# Patient Record
Sex: Male | Born: 1980 | Race: White | Hispanic: No | Marital: Married | Smoking: Former smoker
Health system: Southern US, Community
[De-identification: ages and names within clinical notes are randomized; demographics above are authoritative.]

## PROBLEM LIST (undated history)

## (undated) DIAGNOSIS — K219 Gastro-esophageal reflux disease without esophagitis: Secondary | ICD-10-CM

## (undated) HISTORY — PX: TONSILLECTOMY AND ADENOIDECTOMY: SHX28

## (undated) HISTORY — DX: Gastro-esophageal reflux disease without esophagitis: K21.9

---

## 2015-04-23 ENCOUNTER — Ambulatory Visit: Payer: Self-pay | Admitting: Family Medicine

## 2015-05-22 ENCOUNTER — Encounter: Payer: Self-pay | Admitting: Family Medicine

## 2015-05-22 ENCOUNTER — Ambulatory Visit (INDEPENDENT_AMBULATORY_CARE_PROVIDER_SITE_OTHER): Payer: BLUE CROSS/BLUE SHIELD | Admitting: Family Medicine

## 2015-05-22 VITALS — BP 115/75 | HR 69 | Temp 98.8°F | Resp 16 | Ht 73.0 in | Wt 201.0 lb

## 2015-05-22 DIAGNOSIS — Z7189 Other specified counseling: Secondary | ICD-10-CM

## 2015-05-22 DIAGNOSIS — K219 Gastro-esophageal reflux disease without esophagitis: Secondary | ICD-10-CM | POA: Diagnosis not present

## 2015-05-22 DIAGNOSIS — Z7689 Persons encountering health services in other specified circumstances: Secondary | ICD-10-CM

## 2015-05-22 MED ORDER — PANTOPRAZOLE SODIUM 40 MG PO TBEC: 40.0000 mg | DELAYED_RELEASE_TABLET | Freq: Every day | ORAL | Status: DC

## 2015-05-22 NOTE — Progress Notes (Signed)
Name: Juan Alvarez   MRN: [id number]    DOB: 28-Jun-1980   Date:05/22/2015       Progress Note  Subjective  Chief Complaint  Chief Complaint  Patient presents with  . Establish Care    HPI  Here to establish care.  He has no major medical problems.  C/o heartburn.  Present x 1 year.  Stopped smoking about that time.  Drinks 1-3 beers a week.  Red sauces make heartburn worse.  Greasy foods also makes worse.   Uses TUMS.    No problem-specific assessment & plan notes found for this encounter.   Past Medical History  Diagnosis Date  . GERD (gastroesophageal reflux disease)     Past Surgical History  Procedure Laterality Date  . Tonsillectomy and adenoidectomy N/A 2nd grade    History reviewed. No pertinent family history.  Social History   Social History  . Marital Status: Married    Spouse Name: N/A  . Number of Children: N/A  . Years of Education: N/A   Occupational History  . Not on file.   Social History Main Topics  . Smoking status: Current Every Day Smoker -- 1.00 packs/day for 20 years    Types: Cigarettes  . Smokeless tobacco: Former Neurosurgeon  . Alcohol Use: 1.8 oz/week    3 Cans of beer per week  . Drug Use: Not on file  . Sexual Activity: Not on file   Other Topics Concern  . Not on file   Social History Narrative  . No narrative on file     Current outpatient prescriptions:  .  pantoprazole (PROTONIX) 40 MG tablet, Take 1 tablet (40 mg total) by mouth daily., Disp: 30 tablet, Rfl: 12  Allergies  Allergen Reactions  . Penicillins Rash     Review of Systems  Constitutional: Negative for fever, chills, weight loss and malaise/fatigue.  HENT: Negative for hearing loss.   Eyes: Negative for blurred vision and double vision.  Respiratory: Negative for cough, shortness of breath and wheezing.   Cardiovascular: Negative for chest pain, palpitations and leg swelling.  Gastrointestinal: Positive for heartburn and nausea. Negative for vomiting,  abdominal pain, diarrhea, constipation and blood in stool.  Genitourinary: Negative for dysuria, urgency and frequency.  Musculoskeletal: Negative for myalgias and joint pain.  Skin: Negative for rash.  Neurological: Negative for tremors, weakness and headaches.      Objective  Filed Vitals:   05/22/15 1522  BP: 115/75  Pulse: 69  Temp: 98.8 F (37.1 C)  TempSrc: Oral  Resp: 16  Height:  (1.854 m)  Weight: 201 lb (91.173 kg)  SpO2: 98%    Physical Exam  Constitutional: He is oriented to person, place, and time and well-developed, well-nourished, and in no distress. No distress.  HENT:  Head: Normocephalic and atraumatic.  Eyes: Conjunctivae and EOM are normal. Pupils are equal, round, and reactive to light. No scleral icterus.  Neck: Normal range of motion. Neck supple. Carotid bruit is not present. No thyromegaly present.  Cardiovascular: Normal rate, regular rhythm and normal heart sounds.  Exam reveals no gallop and no friction rub.   No murmur heard. Pulmonary/Chest: Effort normal and breath sounds normal. No respiratory distress. He has no wheezes. He has no rales.  Abdominal: Soft. Bowel sounds are normal. He exhibits no distension and no mass. There is no tenderness.  Musculoskeletal: He exhibits no edema.  Lymphadenopathy:    He has no cervical adenopathy.  Neurological: He is alert and oriented  to person, place, and time.  Vitals reviewed.      No results found for this or any previous visit (from the past 2160 hour(s)).   Assessment & Plan  Problem List Items Addressed This Visit      Digestive   GERD (gastroesophageal reflux disease)   Relevant Medications   pantoprazole (PROTONIX) 40 MG tablet     Other   Encounter to establish care - Primary   Relevant Orders   Comprehensive Metabolic Panel (CMET)   CBC with Differential   Lipid Profile      Meds ordered this encounter  Medications  . pantoprazole (PROTONIX) 40 MG tablet    Sig:  Take 1 tablet (40 mg total) by mouth daily.    Dispense:  30 tablet    Refill:  12   1. Encounter to establish care  - Comprehensive Metabolic Panel (CMET) - CBC with Differential - Lipid Profile  2. Gastroesophageal reflux disease without esophagitis  - pantoprazole (PROTONIX) 40 MG tablet; Take 1 tablet (40 mg total) by mouth daily.  Dispense: 30 tablet; Refill: 12

## 2015-06-09 LAB — COMPREHENSIVE METABOLIC PANEL
A/G RATIO: 1.5 (ref 1.1–2.5)
ALT: 90 IU/L — AB (ref 0–44)
AST: 53 IU/L — AB (ref 0–40)
Albumin: 4.3 g/dL (ref 3.5–5.5)
Alkaline Phosphatase: 75 IU/L (ref 39–117)
BUN/Creatinine Ratio: 11 (ref 8–19)
BUN: 11 mg/dL (ref 6–20)
Bilirubin Total: 0.4 mg/dL (ref 0.0–1.2)
CALCIUM: 9.1 mg/dL (ref 8.7–10.2)
CO2: 24 mmol/L (ref 18–29)
CREATININE: 1.01 mg/dL (ref 0.76–1.27)
Chloride: 97 mmol/L (ref 96–106)
GFR calc Af Amer: 111 mL/min/{1.73_m2} (ref >59)
GFR, EST NON AFRICAN AMERICAN: 96 mL/min/{1.73_m2} (ref >59)
GLUCOSE: 91 mg/dL (ref 65–99)
Globulin, Total: 2.8 g/dL (ref 1.5–4.5)
Potassium: 4.3 mmol/L (ref 3.5–5.2)
Sodium: 139 mmol/L (ref 134–144)
TOTAL PROTEIN: 7.1 g/dL (ref 6.0–8.5)

## 2015-06-09 LAB — CBC WITH DIFFERENTIAL/PLATELET
BASOS: 1 %
Basophils Absolute: 0 10*3/uL (ref 0.0–0.2)
EOS (ABSOLUTE): 0.1 10*3/uL (ref 0.0–0.4)
EOS: 2 %
HEMOGLOBIN: 16.3 g/dL (ref 12.6–17.7)
Hematocrit: 48.1 % (ref 37.5–51.0)
IMMATURE GRANS (ABS): 0 10*3/uL (ref 0.0–0.1)
IMMATURE GRANULOCYTES: 0 %
LYMPHS: 36 %
Lymphocytes Absolute: 2.1 10*3/uL (ref 0.7–3.1)
MCH: 30.8 pg (ref 26.6–33.0)
MCHC: 33.9 g/dL (ref 31.5–35.7)
MCV: 91 fL (ref 79–97)
MONOCYTES: 7 %
Monocytes Absolute: 0.4 10*3/uL (ref 0.1–0.9)
NEUTROS ABS: 3.3 10*3/uL (ref 1.4–7.0)
NEUTROS PCT: 54 %
PLATELETS: 226 10*3/uL (ref 150–379)
RBC: 5.3 x10E6/uL (ref 4.14–5.80)
RDW: 13.3 % (ref 12.3–15.4)
WBC: 6 10*3/uL (ref 3.4–10.8)

## 2015-06-09 LAB — LIPID PANEL
CHOLESTEROL TOTAL: 206 mg/dL — AB (ref 100–199)
Chol/HDL Ratio: 4.9 ratio units (ref 0.0–5.0)
HDL: 42 mg/dL (ref >39)
LDL Calculated: 140 mg/dL — ABNORMAL HIGH (ref 0–99)
Triglycerides: 120 mg/dL (ref 0–149)
VLDL Cholesterol Cal: 24 mg/dL (ref 5–40)

## 2015-06-14 ENCOUNTER — Encounter: Payer: Self-pay | Admitting: *Deleted

## 2015-06-15 LAB — HEPATITIS C ANTIBODY: Hep C Virus Ab: 0.1 s/co ratio (ref 0.0–0.9)

## 2015-06-19 LAB — SPECIMEN STATUS REPORT

## 2015-06-19 LAB — HCV ANTIBODY: Hep C Virus Ab: 0.1 s/co ratio (ref 0.0–0.9)

## 2015-07-03 ENCOUNTER — Ambulatory Visit (INDEPENDENT_AMBULATORY_CARE_PROVIDER_SITE_OTHER): Payer: BLUE CROSS/BLUE SHIELD | Admitting: Family Medicine

## 2015-07-03 ENCOUNTER — Encounter: Payer: Self-pay | Admitting: Family Medicine

## 2015-07-03 VITALS — BP 126/71 | HR 69 | Temp 99.1°F | Resp 16 | Wt 203.4 lb

## 2015-07-03 DIAGNOSIS — K219 Gastro-esophageal reflux disease without esophagitis: Secondary | ICD-10-CM

## 2015-07-03 DIAGNOSIS — R748 Abnormal levels of other serum enzymes: Secondary | ICD-10-CM | POA: Insufficient documentation

## 2015-07-03 NOTE — Progress Notes (Signed)
Name: Juan Alvarez   MRN: [id number]    DOB: [birth year]   Date:07/03/2015       Progress Note  Subjective  Chief Complaint  Chief Complaint  Patient presents with  . Follow-up    Heartburn medication update    HPI Here to f/u gastritis.  HD is much better on PPI.  Still has a brief episode (about 1 min) of epigastric pain about 3 PM each day.  Overall he is doing much better.  Has changed diet.  Much less caffeine.  No tobacco x years.  Rare alcohol.  He has sl. Elevated AST and ALT. His Hep C was neg.    No problem-specific assessment & plan notes found for this encounter.   Past Medical History  Diagnosis Date  . GERD (gastroesophageal reflux disease)     Past Surgical History  Procedure Laterality Date  . Tonsillectomy and adenoidectomy N/A 2nd grade    History reviewed. No pertinent family history.  Social History   Social History  . Marital Status: Married    Spouse Name: N/A  . Number of Children: N/A  . Years of Education: N/A   Occupational History  . Not on file.   Social History Main Topics  . Smoking status: Former Smoker -- 1.00 packs/day for 20 years    Types: Cigarettes  . Smokeless tobacco: Former NeurosurgeonUser  . Alcohol Use: 1.8 oz/week    3 Cans of beer per week  . Drug Use: Not on file  . Sexual Activity: Not on file   Other Topics Concern  . Not on file   Social History Narrative     Current outpatient prescriptions:  .  pantoprazole (PROTONIX) 40 MG tablet, Take 1 tablet (40 mg total) by mouth daily., Disp: 30 tablet, Rfl: 12  Allergies  Allergen Reactions  . Penicillins Rash     Review of Systems  Constitutional: Negative for fever, chills, weight loss and malaise/fatigue.  HENT: Negative for hearing loss.   Eyes: Negative for blurred vision and double vision.  Respiratory: Negative for cough, shortness of breath and wheezing.   Cardiovascular: Negative for chest pain, palpitations and leg swelling.  Gastrointestinal: Positive for  abdominal pain (rare, mild). Negative for heartburn.  Genitourinary: Negative for dysuria, urgency and frequency.  Skin: Negative for rash.  Neurological: Negative for weakness and headaches.      Objective  Filed Vitals:   07/03/15 1621  BP: 126/71  Pulse: 69  Temp: 99.1 F (37.3 C)  TempSrc: Oral  Resp: 16  Weight: 203 lb 6.4 oz (92.262 kg)  SpO2: 96%    Physical Exam  Constitutional: He is well-developed, well-nourished, and in no distress. No distress.  HENT:  Head: Normocephalic and atraumatic.  Neck: Normal range of motion. Neck supple. No thyromegaly present.  Cardiovascular: Normal rate, regular rhythm and normal heart sounds.  Exam reveals no gallop and no friction rub.   No murmur heard. Pulmonary/Chest: Effort normal and breath sounds normal. No respiratory distress. He has no wheezes. He has no rales.  Abdominal: Bowel sounds are normal. He exhibits no distension and no mass. There is no tenderness.  Lymphadenopathy:    He has no cervical adenopathy.  Vitals reviewed.      Recent Results (from the past 2160 hour(s))  Comprehensive Metabolic Panel (CMET)     Status: Abnormal   Collection Time: 06/08/15  8:12 AM  Result Value Ref Range   Glucose 91 65 - 99 mg/dL   BUN 11  6 - 20 mg/dL   Creatinine, Ser 4.09 0.76 - 1.27 mg/dL   GFR calc non Af Amer 96 >59 mL/min/1.73   GFR calc Af Amer 111 >59 mL/min/1.73   BUN/Creatinine Ratio 11 8 - 19   Sodium 139 134 - 144 mmol/L   Potassium 4.3 3.5 - 5.2 mmol/L   Chloride 97 96 - 106 mmol/L   CO2 24 18 - 29 mmol/L   Calcium 9.1 8.7 - 10.2 mg/dL   Total Protein 7.1 6.0 - 8.5 g/dL   Albumin 4.3 3.5 - 5.5 g/dL   Globulin, Total 2.8 1.5 - 4.5 g/dL   Albumin/Globulin Ratio 1.5 1.1 - 2.5    Comment: **Effective June 11, 2015 the reference interval**   for A/G Ratio will be changing to:              Age                Male          Male           0 -  7 days       1.1 - 2.3       1.1 - 2.3           8 - 30 days        1.2 - 2.8       1.2 - 2.8           1 -  6 months     1.3 - 3.6       1.3 - 3.6    7 months -  5 years      1.5 - 2.6       1.5 - 2.6              > 5 years      1.2 - 2.2       1.2 - 2.2    Bilirubin Total 0.4 0.0 - 1.2 mg/dL   Alkaline Phosphatase 75 39 - 117 IU/L   AST 53 (H) 0 - 40 IU/L   ALT 90 (H) 0 - 44 IU/L  CBC with Differential     Status: None   Collection Time: 06/08/15  8:12 AM  Result Value Ref Range   WBC 6.0 3.4 - 10.8 x10E3/uL   RBC 5.30 4.14 - 5.80 x10E6/uL   Hemoglobin 16.3 12.6 - 17.7 g/dL   Hematocrit 81.1 91.4 - 51.0 %   MCV 91 79 - 97 fL   MCH 30.8 26.6 - 33.0 pg   MCHC 33.9 31.5 - 35.7 g/dL   RDW 78.2 95.6 - 21.3 %   Platelets 226 150 - 379 x10E3/uL   Neutrophils 54 %   Lymphs 36 %   Monocytes 7 %   Eos 2 %   Basos 1 %   Neutrophils Absolute 3.3 1.4 - 7.0 x10E3/uL   Lymphocytes Absolute 2.1 0.7 - 3.1 x10E3/uL   Monocytes Absolute 0.4 0.1 - 0.9 x10E3/uL   EOS (ABSOLUTE) 0.1 0.0 - 0.4 x10E3/uL   Basophils Absolute 0.0 0.0 - 0.2 x10E3/uL   Immature Granulocytes 0 %   Immature Grans (Abs) 0.0 0.0 - 0.1 x10E3/uL  Lipid Profile     Status: Abnormal   Collection Time: 06/08/15  8:12 AM  Result Value Ref Range   Cholesterol, Total 206 (H) 100 - 199 mg/dL   Triglycerides 086 0 - 149 mg/dL   HDL 42 >57 mg/dL   VLDL Cholesterol Cal 24  5 - 40 mg/dL   LDL Calculated 161 (H) 0 - 99 mg/dL   Chol/HDL Ratio 4.9 0.0 - 5.0 ratio units    Comment:                                   T. Chol/HDL Ratio                                             Men  Women                               1/2 Avg.Risk  3.4    3.3                                   Avg.Risk  5.0    4.4                                2X Avg.Risk  9.6    7.1                                3X Avg.Risk 23.4   11.0   Hepatitis C antibody     Status: None   Collection Time: 06/08/15  8:12 AM  Result Value Ref Range   Hep C Virus Ab 0.1 0.0 - 0.9 s/co ratio    Comment:                                    Negative:     < 0.8                              Indeterminate: 0.8 - 0.9                                   Positive:     > 0.9  The CDC recommends that a positive HCV antibody result  be followed up with a HCV Nucleic Acid Amplification  test (096045).   Specimen status report     Status: None   Collection Time: 06/08/15  8:12 AM  Result Value Ref Range   specimen status report Comment     Comment: Written Authorization Written Authorization Written Authorization Received. Authorization received from [facility] CMA 06-19-2015 Logged by Harl Bowie   HCV Antibody     Status: None   Collection Time: 06/08/15  8:12 AM  Result Value Ref Range   Hep C Virus Ab 0.1 0.0 - 0.9 s/co ratio    Comment:                                   Negative:     < 0.8  Indeterminate: 0.8 - 0.9                                   Positive:     > 0.9  The CDC recommends that a positive HCV antibody result  be followed up with a HCV Nucleic Acid Amplification  test (161096).      Assessment & Plan  Problem List Items Addressed This Visit      Digestive   GERD (gastroesophageal reflux disease) - Primary     Other   Elevated liver enzymes   Relevant Orders   Hepatic function panel      No orders of the defined types were placed in this encounter.   1. Gastroesophageal reflux disease without esophagitis Cont. Pantoprazole  2. Elevated liver enzymes  - Hepatic function panel-get drawn in about 1 month

## 2015-08-03 DIAGNOSIS — R748 Abnormal levels of other serum enzymes: Secondary | ICD-10-CM | POA: Diagnosis not present

## 2015-08-04 LAB — HEPATIC FUNCTION PANEL
ALT: 40 IU/L (ref 0–44)
AST: 32 IU/L (ref 0–40)
Albumin: 4.4 g/dL (ref 3.5–5.5)
Alkaline Phosphatase: 69 IU/L (ref 39–117)
Bilirubin Total: 0.4 mg/dL (ref 0.0–1.2)
Bilirubin, Direct: 0.1 mg/dL (ref 0.00–0.40)
TOTAL PROTEIN: 6.9 g/dL (ref 6.0–8.5)

## 2016-06-24 DIAGNOSIS — J019 Acute sinusitis, unspecified: Secondary | ICD-10-CM | POA: Diagnosis not present

## 2016-06-24 DIAGNOSIS — R05 Cough: Secondary | ICD-10-CM | POA: Diagnosis not present

## 2017-05-04 ENCOUNTER — Emergency Department: Payer: BLUE CROSS/BLUE SHIELD

## 2017-05-04 ENCOUNTER — Other Ambulatory Visit: Payer: Self-pay

## 2017-05-04 ENCOUNTER — Emergency Department
Admission: EM | Admit: 2017-05-04 | Discharge: 2017-05-04 | Disposition: A | Discharge status: Home / Self Care | Payer: BLUE CROSS/BLUE SHIELD | Attending: Student in an Organized Health Care Education/Training Program | Admitting: Student in an Organized Health Care Education/Training Program

## 2017-05-04 ENCOUNTER — Encounter: Payer: Self-pay | Admitting: Emergency Medicine

## 2017-05-04 DIAGNOSIS — Z135 Encounter for screening for eye and ear disorders: Secondary | ICD-10-CM | POA: Diagnosis not present

## 2017-05-04 DIAGNOSIS — Z79899 Other long term (current) drug therapy: Secondary | ICD-10-CM | POA: Insufficient documentation

## 2017-05-04 DIAGNOSIS — Z87891 Personal history of nicotine dependence: Secondary | ICD-10-CM | POA: Diagnosis not present

## 2017-05-04 DIAGNOSIS — R42 Dizziness and giddiness: Secondary | ICD-10-CM | POA: Insufficient documentation

## 2017-05-04 DIAGNOSIS — T1590XA Foreign body on external eye, part unspecified, unspecified eye, initial encounter: Secondary | ICD-10-CM

## 2017-05-04 DIAGNOSIS — R51 Headache: Secondary | ICD-10-CM | POA: Diagnosis not present

## 2017-05-04 LAB — APTT: aPTT: 28 seconds (ref 24–36)

## 2017-05-04 LAB — CBC
HCT: 48.3 % (ref 40.0–52.0)
Hemoglobin: 16.7 g/dL (ref 13.0–18.0)
MCH: 30.8 pg (ref 26.0–34.0)
MCHC: 34.6 g/dL (ref 32.0–36.0)
MCV: 89.1 fL (ref 80.0–100.0)
Platelets: 201 10*3/uL (ref 150–440)
RBC: 5.43 MIL/uL (ref 4.40–5.90)
RDW: 12.9 % (ref 11.5–14.5)
WBC: 5.6 10*3/uL (ref 3.8–10.6)

## 2017-05-04 LAB — DIFFERENTIAL
Basophils Absolute: 0 10*3/uL (ref 0–0.1)
Basophils Relative: 1 %
EOS ABS: 0.1 10*3/uL (ref 0–0.7)
EOS PCT: 1 %
LYMPHS ABS: 1.6 10*3/uL (ref 1.0–3.6)
Lymphocytes Relative: 29 %
MONO ABS: 0.4 10*3/uL (ref 0.2–1.0)
Monocytes Relative: 7 %
NEUTROS PCT: 62 %
Neutro Abs: 3.5 10*3/uL (ref 1.4–6.5)

## 2017-05-04 LAB — COMPREHENSIVE METABOLIC PANEL
ALT: 115 U/L — ABNORMAL HIGH (ref 17–63)
AST: 62 U/L — ABNORMAL HIGH (ref 15–41)
Albumin: 4.2 g/dL (ref 3.5–5.0)
Alkaline Phosphatase: 64 U/L (ref 38–126)
Anion gap: 10 (ref 5–15)
BUN: 13 mg/dL (ref 6–20)
CO2: 23 mmol/L (ref 22–32)
Calcium: 9.5 mg/dL (ref 8.9–10.3)
Chloride: 107 mmol/L (ref 101–111)
Creatinine, Ser: 0.99 mg/dL (ref 0.61–1.24)
GFR calc Af Amer: >60 mL/min (ref >60)
GFR calc non Af Amer: >60 mL/min (ref >60)
Glucose, Bld: 135 mg/dL — ABNORMAL HIGH (ref 65–99)
Potassium: 3.8 mmol/L (ref 3.5–5.1)
Sodium: 140 mmol/L (ref 135–145)
Total Bilirubin: 0.6 mg/dL (ref 0.3–1.2)
Total Protein: 7.8 g/dL (ref 6.5–8.1)

## 2017-05-04 LAB — PROTIME-INR
INR: 0.94
PROTHROMBIN TIME: 12.5 s (ref 11.4–15.2)

## 2017-05-04 LAB — TROPONIN I

## 2017-05-04 MED ORDER — SODIUM CHLORIDE 0.9 % IV BOLUS (SEPSIS)
1000.0000 mL | Freq: Once | INTRAVENOUS | Status: AC
  Administered 2017-05-04: 1000 mL via INTRAVENOUS

## 2017-05-04 MED ORDER — MECLIZINE HCL 25 MG PO TABS: 25.0000 mg | ORAL_TABLET | Freq: Three times a day (TID) | ORAL | 0 refills | Status: AC | PRN

## 2017-05-04 MED ORDER — IOPAMIDOL (ISOVUE-370) INJECTION 76%
75.0000 mL | Freq: Once | INTRAVENOUS | Status: AC | PRN
  Administered 2017-05-04: 75 mL via INTRAVENOUS

## 2017-05-04 MED ORDER — MECLIZINE HCL 25 MG PO TABS
25.0000 mg | ORAL_TABLET | Freq: Once | ORAL | Status: AC
  Administered 2017-05-04: 25 mg via ORAL
  Filled 2017-05-04: qty 1

## 2017-05-04 NOTE — ED Notes (Signed)
Patient transported to XR. 

## 2017-05-04 NOTE — ED Provider Notes (Signed)
[facility] Emergency Department Provider Note    First MD Initiated Contact with Patient 05/04/17 0818     (approximate)  I have reviewed the triage vital signs and the nursing notes.   HISTORY  Chief Complaint Dizziness    HPI Juan Alvarez is a 37 y.o. male no significant past medical history presents with sudden onset of dizziness is worsened with movement.  Denies any numbness or tingling.  No recent fevers.  Is never had any symptoms like this before.  No headache.  No trauma.  He had a half a shot of rum last night but does not endorse much drinking.  No diarrhea or vomiting.  No blurry vision.  States that symptoms are worsened with movement.  States that when he is lying in bed he was having more difficulty on the left side when he rolled over but got some relief from laying on the right.  Wife became concerned because when she spoke with her phone she says that he sounded like he was slurring his words.  Patient does not recall this states that he was very dizzy was having difficulty walking.  Remote history of smoking.  No history of dysrhythmia.  Past Medical History:  Diagnosis Date  . GERD (gastroesophageal reflux disease)    No family history on file. Past Surgical History:  Procedure Laterality Date  . TONSILLECTOMY AND ADENOIDECTOMY N/A 2nd grade   Patient Active Problem List   Diagnosis Date Noted  . Elevated liver enzymes 07/03/2015  . GERD (gastroesophageal reflux disease) 05/22/2015  . Encounter to establish care 05/22/2015      Prior to Admission medications   Medication Sig Start Date End Date Taking? Authorizing Provider  calcium carbonate (TUMS - DOSED IN MG ELEMENTAL CALCIUM) 500 MG chewable tablet Chew 1 tablet by mouth 3 (three) times daily as needed for heartburn.    Yes [provider]  meclizine (ANTIVERT) 25 MG tablet Take 1 tablet (25 mg total) by mouth 3 (three) times daily as needed for dizziness. 05/04/17    Willy Eddyobinson, Aseem Sessums, MD  pantoprazole (PROTONIX) 40 MG tablet Take 1 tablet (40 mg total) by mouth daily. Patient not taking: Reported on 05/04/2017 05/22/15   Janeann ForehandHawkins, James H Jr., MD    Allergies Penicillins    Social History Social History   Tobacco Use  . Smoking status: Former Smoker    Packs/day: 1.00    Years: 20.00    Pack years: 20.00    Types: Cigarettes  . Smokeless tobacco: Former Engineer, waterUser  Substance Use Topics  . Alcohol use: Yes    Alcohol/week: 1.8 oz    Types: 3 Cans of beer per week  . Drug use: Not on file    Review of Systems Patient denies headaches, rhinorrhea, blurry vision, numbness, shortness of breath, chest pain, edema, cough, abdominal pain, nausea, vomiting, diarrhea, dysuria, fevers, rashes or hallucinations unless otherwise stated above in HPI. ____________________________________________   PHYSICAL EXAM:  VITAL SIGNS: Vitals:   05/04/17 1417 05/04/17 1419  BP:  (!) 140/93  Pulse: 76   Resp: 18   Temp:    SpO2: 97%     Constitutional: Alert and oriented. Well appearing and in no acute distress. Eyes: Conjunctivae are normal.  Head: Atraumatic. Nose: No congestion/rhinnorhea. Mouth/Throat: Mucous membranes are moist.   Neck: No stridor. Painless ROM.  Cardiovascular: Normal rate, regular rhythm. Grossly normal heart sounds.  Good peripheral circulation. Respiratory: Normal respiratory effort.  No retractions. Lungs CTAB. Gastrointestinal:  Soft and nontender. No distention. No abdominal bruits. No CVA tenderness. Genitourinary: Musculoskeletal: No lower extremity tenderness nor edema.  No joint effusions. Neurologic:  CN- intact.  Right inducible nystagmus, - Skew, - impulse No facial droop, Normal FNF.  Normal heel to shin.  Sensation intact bilaterally. Normal speech and language. No gross focal neurologic deficits are appreciated. No gait instability. Skin:  Skin is warm, dry and intact. No rash noted. Psychiatric: Mood and affect are  normal. Speech and behavior are normal.  ____________________________________________   LABS (all labs ordered are listed, but only abnormal results are displayed)  Results for orders placed or performed during the hospital encounter of 05/04/17 (from the past 24 hour(s))  Protime-INR     Status: None   Collection Time: 05/04/17  7:50 AM  Result Value Ref Range   Prothrombin Time 12.5 11.4 - 15.2 seconds   INR 0.94   APTT     Status: None   Collection Time: 05/04/17  7:50 AM  Result Value Ref Range   aPTT 28 24 - 36 seconds  CBC     Status: None   Collection Time: 05/04/17  7:50 AM  Result Value Ref Range   WBC 5.6 3.8 - 10.6 K/uL   RBC 5.43 4.40 - 5.90 MIL/uL   Hemoglobin 16.7 13.0 - 18.0 g/dL   HCT 16.1 09.6 - 04.5 %   MCV 89.1 80.0 - 100.0 fL   MCH 30.8 26.0 - 34.0 pg   MCHC 34.6 32.0 - 36.0 g/dL   RDW 40.9 81.1 - 91.4 %   Platelets 201 150 - 440 K/uL  Differential     Status: None   Collection Time: 05/04/17  7:50 AM  Result Value Ref Range   Neutrophils Relative % 62 %   Neutro Abs 3.5 1.4 - 6.5 K/uL   Lymphocytes Relative 29 %   Lymphs Abs 1.6 1.0 - 3.6 K/uL   Monocytes Relative 7 %   Monocytes Absolute 0.4 0.2 - 1.0 K/uL   Eosinophils Relative 1 %   Eosinophils Absolute 0.1 0 - 0.7 K/uL   Basophils Relative 1 %   Basophils Absolute 0.0 0 - 0.1 K/uL  Comprehensive metabolic panel     Status: Abnormal   Collection Time: 05/04/17  7:50 AM  Result Value Ref Range   Sodium 140 135 - 145 mmol/L   Potassium 3.8 3.5 - 5.1 mmol/L   Chloride 107 101 - 111 mmol/L   CO2 23 22 - 32 mmol/L   Glucose, Bld 135 (H) 65 - 99 mg/dL   BUN 13 6 - 20 mg/dL   Creatinine, Ser 7.82 0.61 - 1.24 mg/dL   Calcium 9.5 8.9 - 95.6 mg/dL   Total Protein 7.8 6.5 - 8.1 g/dL   Albumin 4.2 3.5 - 5.0 g/dL   AST 62 (H) 15 - 41 U/L   ALT 115 (H) 17 - 63 U/L   Alkaline Phosphatase 64 38 - 126 U/L   Total Bilirubin 0.6 0.3 - 1.2 mg/dL   GFR calc non Af Amer >60 >60 mL/min   GFR calc Af Amer  >60 >60 mL/min   Anion gap 10 5 - 15  Troponin I     Status: None   Collection Time: 05/04/17  7:50 AM  Result Value Ref Range   Troponin I <0.03 <0.03 ng/mL   ____________________________________________  EKG My review and personal interpretation at Time: 7:45   Indication: dizziness  Rate: 70  Rhythm: sinus Axis: normal  Other: normal intervals, no stemi, ____________________________________________  RADIOLOGY  I personally reviewed all radiographic images ordered to evaluate for the above acute complaints and reviewed radiology reports and findings.  These findings were personally discussed with the patient.  Please see medical record for radiology report.  ____________________________________________   PROCEDURES  Procedure(s) performed:  Procedures    Critical Care performed: no ____________________________________________   INITIAL IMPRESSION / ASSESSMENT AND PLAN / ED COURSE  Pertinent labs & imaging results that were available during my care of the patient were reviewed by me and considered in my medical decision making (see chart for details).  DDX: bppv, menieres, labyrinthitis, cva, dehydration  Juan Alvarez is a 37 y.o. who presents to the ED with symptoms as described above.  Patient nontoxic appearing.  Neuro exam as above.  Patient will be very low risk for CVA.  Most clinically consistent with vertigo given the positional nature and sudden onset.  Clinical Course as of May 04 1499  Mon May 04, 2017  5366 Patient reassessed.  Still dizzy and having difficulty walking.  Given the differential including CVA labyrinthitis neuroma will order MRI to exclude central lesion.  [PR]  1314 Patient unable to tolerate MRI due to claustrophobia.  This point he has no focal neuro deficits.  Symptoms seem to have significantly improved.  Patient is exceedingly low risk for central lesion and he would be stable and appropriate for outpatient workup.  [PR]  1408 Patient  was agreeable to have CT imaging with IV contrast which showed no evidence of aneurysm ischemia or edema.  Patient stable and appropriate for outpatient follow-up.  [PR]    Clinical Course User Index [PR] Willy Eddy, MD     ____________________________________________   FINAL CLINICAL IMPRESSION(S) / ED DIAGNOSES  Final diagnoses:  Dizziness      NEW MEDICATIONS STARTED DURING THIS VISIT:  Discharge Medication List as of 05/04/2017  2:09 PM    START taking these medications   Details  meclizine (ANTIVERT) 25 MG tablet Take 1 tablet (25 mg total) by mouth 3 (three) times daily as needed for dizziness., Starting Mon 05/04/2017, Print         Note:  This document was prepared using Dragon voice recognition software and may include unintentional dictation errors.    Willy Eddy, MD 05/04/17 1501

## 2017-05-04 NOTE — ED Triage Notes (Addendum)
States awoke this am with dizziness. Went to bed at 2230 last night with no symptoms. Continues to have dizziness at this point. No weakness. Wife states he called her at [phone] and he could not get words and speech was slurred. Speech now clear and appropriate.

## 2017-05-04 NOTE — ED Notes (Signed)
Patient transported to MR. 

## 2017-05-04 NOTE — ED Notes (Signed)
Pt states he went to bed last night around 2230 normal. States he woke up around 0530am with c/o dizziness, states called his wife and she states he was not making any since. Pt speech is clear on arrival, pt states he still has dizziness but not as bad as it was earlier.  No noted neuro deficits on arrival.

## 2017-05-04 NOTE — ED Notes (Signed)
Pt ambulatory upon discharge. Verbalized understanding of discharge instructions, follow-up care and prescription. VSS. Skin warm and dry. A&O x4.  

## 2017-05-11 ENCOUNTER — Encounter: Payer: Self-pay | Admitting: Family Medicine

## 2017-05-11 ENCOUNTER — Ambulatory Visit (INDEPENDENT_AMBULATORY_CARE_PROVIDER_SITE_OTHER): Payer: BLUE CROSS/BLUE SHIELD | Admitting: Family Medicine

## 2017-05-11 VITALS — BP 129/78 | HR 72 | Temp 98.8°F | Resp 16 | Ht 72.0 in | Wt 221.0 lb

## 2017-05-11 DIAGNOSIS — H8112 Benign paroxysmal vertigo, left ear: Secondary | ICD-10-CM | POA: Diagnosis not present

## 2017-05-11 DIAGNOSIS — R748 Abnormal levels of other serum enzymes: Secondary | ICD-10-CM | POA: Diagnosis not present

## 2017-05-11 DIAGNOSIS — K219 Gastro-esophageal reflux disease without esophagitis: Secondary | ICD-10-CM

## 2017-05-11 DIAGNOSIS — Z7689 Persons encountering health services in other specified circumstances: Secondary | ICD-10-CM

## 2017-05-11 NOTE — Progress Notes (Signed)
Subjective:    Patient ID: Juan AndrewJoshua Inga, male    DOB: [birth year], 37 y.o.   MRN: [id number]  Juan Alvarez is a 37 y.o. male presenting on 05/11/2017 for Establish Care and Hospitalization Follow-up (dizzyness)  Patient is here to re-establish care with new PCP after previous PCP Dr Juanetta GoslingHawkins ([facility]) has retired. His last visit with Dr Juanetta GoslingHawkins was 06/2015.  He is here also for a Emergency Department follow-up.  HPI   ED FOLLOW-UP VISIT  Hospital/Location: [facility] Date of ED Visit: 05/04/17  Reason for Presenting to ED: Dizziness  FOLLOW-UP  - ED provider note and record have been reviewed - Patient presents today about 7 days after recent ED visit. Brief summary of recent course, patient had symptoms of acute onset vertigo vs dizziness with "room spinning" and "felt like falling" he stayed in bed for longer in that morning and did not improve, worse if laying on LEFT side of body and turn to Left, he ate half cliff bar and half coke reduced appetite, he got ready for work and then he said his vision got blurry and he was driving to work, he called boss and pulled over in parking lot and then he states he "lost time" and does not recall getting from one parking lot to another, and then friend called 911 / EMS, he was told that in hospital ED he was "confused and slurring words", he states that they checked labs and sugar was fine, and blood pressure was elevated, and he felt "disoriented", he had lab results that showed normal Hgb, elevated non fasting glucose 135, and ALT/ALT liver enzymes were elevated similar but higher result compared to 05/2015, Troponin negative, EKG done unremarkable, Head CT w/o contrast unremarkable, then did x-ray head/eyes to check for metal fragments, 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 Overall symptoms gradually improved, but then had another elevated BP not as high 140/90, similar dizziness, and ultimately he was discharged to outpatient follow-up.  - Today reports overall has still had frequent episodes since discharged with similar less severe dizziness episodes multiple times daily, without worsening or improvement, did not take Meclizine much for this. He tried to rest and seems episodes lasted 10-15 min usually, several per day, still occurring. Currently now not dizzy.  - Admits some Left sided "head not feeling normal" feels like a "pressure deeper in ear", that seems like a Left sided headache episodic few times daily usually lasting 30min to 1 hour, not taking any OTC analgesia for this.  - Also admits associated L sided hand seemed weaker with grip having some tingling  - New medications on discharge: Meclizine, not taking - Changes to current meds on discharge: None  GERD Chronic problem Cannot swallow pills. Can try OTC chewable Zantac  No significant family history of neurological problem or aneurysm.  Health Maintenance: Due for Flu Shot, declines today despite counseling on benefits  Due HIV screen will check old records  [facility] 05/11/2017 05/22/2015  Decreased Interest 0 0  Down, Depressed, Hopeless 0 0  PHQ - 2 Score 0 0    Past Medical History:  Diagnosis Date  . GERD (gastroesophageal reflux disease)    Past Surgical History:  Procedure Laterality Date  .  TONSILLECTOMY AND ADENOIDECTOMY N/A 2nd grade   Social History   Socioeconomic History  . Marital status: Married    Spouse name: Not on file  . Number of children: Not on file  . Years of education: Not on file  . Highest education level: Not on file  Social Needs  . Financial resource strain: Not on file  . Food insecurity - worry: Not on file  . Food insecurity - inability: Not on file  . Transportation needs -  medical: Not on file  . Transportation needs - non-medical: Not on file  Occupational History  . Occupation: Sheet Metal  Tobacco Use  . Smoking status: Former Smoker    Packs/day: 1.00    Years: 20.00    Pack years: 20.00    Types: Cigarettes    Last attempt to quit: 05/19/2014    Years since quitting: 2.9  . Smokeless tobacco: Former Engineer, water and Sexual Activity  . Alcohol use: Yes    Alcohol/week: 1.8 oz    Types: 3 Cans of beer per week  . Drug use: No  . Sexual activity: Not on file  Other Topics Concern  . Not on file  Social History Narrative  . Not on file   History reviewed. No pertinent family history. Current Outpatient Medications on File Prior to Visit  Medication Sig  . calcium carbonate (TUMS - DOSED IN MG ELEMENTAL CALCIUM) 500 MG chewable tablet Chew 1 tablet by mouth 3 (three) times daily as needed for heartburn.   . meclizine (ANTIVERT) 25 MG tablet Take 1 tablet (25 mg total) by mouth 3 (three) times daily as needed for dizziness. (Patient not taking: Reported on 05/11/2017)   No current facility-administered medications on file prior to visit.     Review of Systems  Constitutional: Negative for activity change, appetite change, chills, diaphoresis, fatigue and fever.  HENT: Negative for congestion and hearing loss.   Eyes: Negative for visual disturbance.  Respiratory: Negative for apnea, cough, choking, chest tightness, shortness of breath and wheezing.   Cardiovascular: Negative for chest pain, palpitations and leg swelling.  Gastrointestinal: Negative for abdominal pain, constipation, diarrhea, nausea and vomiting.  Endocrine: Negative for cold intolerance.  Genitourinary: Negative for decreased urine volume, dysuria, frequency, hematuria and urgency.  Musculoskeletal: Negative for arthralgias and neck pain.  Skin: Negative for rash.  Allergic/Immunologic: Negative for environmental allergies.  Neurological: Positive for dizziness, weakness  and headaches. Negative for light-headedness and numbness.  Hematological: Negative for adenopathy.  Psychiatric/Behavioral: Negative for behavioral problems, dysphoric mood and sleep disturbance. The patient is not nervous/anxious.    Per HPI unless specifically indicated above     Objective:    BP 129/78   Pulse 72   Temp 98.8 F (37.1 C) (Oral)   Resp 16   Ht 6' (1.829 m)   Wt 221 lb (100.2 kg)   BMI 29.97 kg/m   Wt Readings from Last 3 Encounters:  05/11/17 221 lb (100.2 kg)  05/04/17 208 lb (94.3 kg)  07/03/15 203 lb 6.4 oz (92.3 kg)    Physical Exam  Constitutional: He is oriented to person, place, and time. He appears well-developed and well-nourished. No distress.  Well-appearing, comfortable, cooperative  HENT:  Head: Normocephalic and atraumatic.  Mouth/Throat: Oropharynx is clear and moist.  Frontal / maxillary sinuses non-tender. Nares patent without purulence or edema. Bilateral TMs clear without erythema, effusion or bulging. Oropharynx clear without erythema, exudates, edema or asymmetry.  Dix-hallpike maneuver without reproduced dizziness  or nystagmus bilateral  Eyes: Conjunctivae are normal. Right eye exhibits no discharge. Left eye exhibits no discharge.  Neck: Normal range of motion. Neck supple. No thyromegaly present.  Cardiovascular: Normal rate, regular rhythm, normal heart sounds and intact distal pulses.  No murmur heard. Pulmonary/Chest: Effort normal and breath sounds normal. No respiratory distress. He has no wheezes. He has no rales.  Musculoskeletal: Normal range of motion. He exhibits no edema.  Lymphadenopathy:    He has no cervical adenopathy.  Neurological: He is alert and oriented to person, place, and time. No cranial nerve deficit.  Distal sensation to light touch intact, grip strength intact  Skin: Skin is warm and dry. No rash noted. He is not diaphoretic. No erythema.  Psychiatric: He has a normal mood and affect. His behavior is  normal.  Well groomed, good eye contact, normal speech and thoughts  Nursing note and vitals reviewed.  Results for orders placed or performed during the hospital encounter of 05/04/17  Protime-INR  Result Value Ref Range   Prothrombin Time 12.5 11.4 - 15.2 seconds   INR 0.94   APTT  Result Value Ref Range   aPTT 28 24 - 36 seconds  CBC  Result Value Ref Range   WBC 5.6 3.8 - 10.6 K/uL   RBC 5.43 4.40 - 5.90 MIL/uL   Hemoglobin 16.7 13.0 - 18.0 g/dL   HCT 16.1 09.6 - 04.5 %   MCV 89.1 80.0 - 100.0 fL   MCH 30.8 26.0 - 34.0 pg   MCHC 34.6 32.0 - 36.0 g/dL   RDW 40.9 81.1 - 91.4 %   Platelets 201 150 - 440 K/uL  Differential  Result Value Ref Range   Neutrophils Relative % 62 %   Neutro Abs 3.5 1.4 - 6.5 K/uL   Lymphocytes Relative 29 %   Lymphs Abs 1.6 1.0 - 3.6 K/uL   Monocytes Relative 7 %   Monocytes Absolute 0.4 0.2 - 1.0 K/uL   Eosinophils Relative 1 %   Eosinophils Absolute 0.1 0 - 0.7 K/uL   Basophils Relative 1 %   Basophils Absolute 0.0 0 - 0.1 K/uL  Comprehensive metabolic panel  Result Value Ref Range   Sodium 140 135 - 145 mmol/L   Potassium 3.8 3.5 - 5.1 mmol/L   Chloride 107 101 - 111 mmol/L   CO2 23 22 - 32 mmol/L   Glucose, Bld 135 (H) 65 - 99 mg/dL   BUN 13 6 - 20 mg/dL   Creatinine, Ser 7.82 0.61 - 1.24 mg/dL   Calcium 9.5 8.9 - 95.6 mg/dL   Total Protein 7.8 6.5 - 8.1 g/dL   Albumin 4.2 3.5 - 5.0 g/dL   AST 62 (H) 15 - 41 U/L   ALT 115 (H) 17 - 63 U/L   Alkaline Phosphatase 64 38 - 126 U/L   Total Bilirubin 0.6 0.3 - 1.2 mg/dL   GFR calc non Af Amer >60 >60 mL/min   GFR calc Af Amer >60 >60 mL/min   Anion gap 10 5 - 15  Troponin I  Result Value Ref Range   Troponin I <0.03 <0.03 ng/mL      Assessment & Plan:   Problem List Items Addressed This Visit    Elevated liver enzymes Uncertain etiology, possible fatty liver, will need lipid panel    GERD (gastroesophageal reflux disease)    Chronic problem Not focus of visit Trial OTC  therapy H2 blocker first BID 2-4 weeks then PRN  Other Visit Diagnoses    BPPV (benign paroxysmal positional vertigo), left    -  Primary Suspected L-BPPV by history not supported by R/L Dix-Hallpike - Concern with other related associated symptoms with L sided headache vs sinus pressure  Plan: 1. Handout given with Epley maneuver TID for 1-2 weeks until resolved 2. Referral to [facility] ENT for 2nd opinion given strong suspicion of vertigo, prior work-up in ED negative for neurological cause, may consider future MRI if required vs neuro referral 3. Return criteria, if not improved consider vestibular PT referral    Encounter to establish care with new doctor     Will review prior PCP records       No orders of the defined types were placed in this encounter.  Orders Placed This Encounter  Procedures  . Ambulatory referral to ENT    Referral Priority:   Routine    Referral Type:   Consultation    Referral Reason:   Specialty Services Required    Requested Specialty:   Otolaryngology    Number of Visits Requested:   1      Follow up plan: Return in about 4 weeks (around 06/08/2017) for Annual Physical.  Saralyn Pilar, DO [facility] 05/12/2017, 1:37 AM

## 2017-05-11 NOTE — Patient Instructions (Addendum)
Thank you for coming to the office today.  1. You have symptoms of Vertigo (Benign Paroxysmal Positional Vertigo) - This is commonly caused by inner ear fluid imbalance, sometimes can be worsened by allergies and sinus symptoms, otherwise it can occur randomly sometimes and we may never discover the exact cause. - To treat this, try the Epley Manuever (see diagrams/instructions below) at home up to 3 times a day for 1-2 weeks or until symptoms resolve - You may take Meclizine as needed up to 3 times a day for dizziness, this will not cure symptoms but may help. Caution may make you drowsy.  For GERD acid reflux - try Chewable zantac usually recommend TWICE daily before meals every day for 2 weeks or more  - Avoid spicy, greasy, fried foods, also things like caffeine, dark chocolate, peppermint can worsen - Avoid large meals and late night snacks, also do not go more than 4-5 hours without a snack or meal (not eating will worsen reflux symptoms due to stomach acid)  If the problem improves but keeps coming back, we can discuss higher dose or longer course at next visit.  If symptoms are worsening, persistent symptoms despite treatment or develop esophageal or abdominal pain, unable to swallow solids or liquids, nausea, vomiting, fever/chills, or unintentional weight loss / no appetite, please follow-up sooner or seek more immediate medical attention.   Referral sent to ENT - stay tuned for appointment - should call within 1-2 weeks, if you don't hear back then call them first by end of the week  [facility] [address]  [place], [place] [address] Ph: [phone]  [facility] [address]  [place], [place] [address] Ph: [phone]   If you develop significant worsening episode with vertigo that does not improve and you get severe headache, loss of vision, arm or leg weakness, slurred speech, or other concerning symptoms please seek  immediate medical attention at Emergency Department.  Please schedule a follow-up appointment with Dr Althea CharonKaramalegos within 2-4 weeks if Vertigo not improving  See the next page for images describing the Epley Manuever.     ----------------------------------------------------------------------------------------------------------------------      DUE for FASTING BLOOD WORK (no food or drink after midnight before the lab appointment, only water or coffee without cream/sugar on the morning of)  SCHEDULE "Lab Only" visit in the morning at the clinic for lab draw in 4 weeks  - Make sure Lab Only appointment is at about 1 week before your next appointment, so that results will be available  For Lab Results, once available within 2-3 days of blood draw, you can can log in to MyChart online to view your results and a brief explanation. Also, we can discuss results at next follow-up visit.   Please schedule a Follow-up Appointment to: Return in about 4 weeks (around 06/08/2017) for Annual Physical.    If you have any other questions or concerns, please feel free to call the office or send a message through MyChart. You may also schedule an earlier appointment if necessary.  Additionally, you may be receiving a survey about your experience at our office within a few days to 1 week by e-mail or mail. We value your feedback.  Saralyn PilarAlexander Karamalegos, DO [facility]

## 2017-05-12 ENCOUNTER — Other Ambulatory Visit: Payer: Self-pay | Admitting: Family Medicine

## 2017-05-12 DIAGNOSIS — R748 Abnormal levels of other serum enzymes: Secondary | ICD-10-CM

## 2017-05-12 DIAGNOSIS — R7309 Other abnormal glucose: Secondary | ICD-10-CM

## 2017-05-12 DIAGNOSIS — Z0000 Encounter for general adult medical examination without abnormal findings: Secondary | ICD-10-CM

## 2017-05-12 DIAGNOSIS — K219 Gastro-esophageal reflux disease without esophagitis: Secondary | ICD-10-CM

## 2017-05-12 DIAGNOSIS — E7800 Pure hypercholesterolemia, unspecified: Secondary | ICD-10-CM | POA: Insufficient documentation

## 2017-05-12 DIAGNOSIS — Z114 Encounter for screening for human immunodeficiency virus [HIV]: Secondary | ICD-10-CM

## 2017-05-12 NOTE — Assessment & Plan Note (Signed)
Chronic problem Not focus of visit Trial OTC therapy H2 blocker first BID 2-4 weeks then PRN

## 2017-06-02 DIAGNOSIS — R42 Dizziness and giddiness: Secondary | ICD-10-CM | POA: Diagnosis not present

## 2017-06-05 ENCOUNTER — Telehealth: Payer: Self-pay | Admitting: Family Medicine

## 2017-06-05 NOTE — Telephone Encounter (Signed)
Pt. Called wanting to know if he needed to cancel appt for Monday he have appt with ENT  March, 20 th, he  Have not had lab. Pt  Call back # is [phone]

## 2017-06-05 NOTE — Telephone Encounter (Signed)
Since he does not see ENT until March 20th, then I would recommend that he reschedule the apt for Monday 06/08/17 for me for Annual Physical.  He has blood work that is ordered. He should re-schedule for both fasting blood work and Annual Physical anytime days to week or so after his upcoming ENT.  Please call patient back to notify.  Saralyn PilarAlexander Isidor Bromell, DO [facility] 06/05/2017, 12:50 PM

## 2017-06-08 ENCOUNTER — Encounter: Payer: BLUE CROSS/BLUE SHIELD | Admitting: Family Medicine

## 2017-06-17 DIAGNOSIS — R42 Dizziness and giddiness: Secondary | ICD-10-CM | POA: Diagnosis not present

## 2017-06-24 DIAGNOSIS — R42 Dizziness and giddiness: Secondary | ICD-10-CM | POA: Diagnosis not present

## 2017-07-03 DIAGNOSIS — R42 Dizziness and giddiness: Secondary | ICD-10-CM | POA: Diagnosis not present

## 2018-06-09 DIAGNOSIS — J029 Acute pharyngitis, unspecified: Secondary | ICD-10-CM | POA: Diagnosis not present

## 2018-06-09 DIAGNOSIS — R509 Fever, unspecified: Secondary | ICD-10-CM | POA: Diagnosis not present

## 2018-06-09 DIAGNOSIS — R6889 Other general symptoms and signs: Secondary | ICD-10-CM | POA: Diagnosis not present

## 2018-06-09 DIAGNOSIS — J101 Influenza due to other identified influenza virus with other respiratory manifestations: Secondary | ICD-10-CM | POA: Diagnosis not present

## 2019-03-12 IMAGING — CT CT ANGIO HEAD
3 of 8 series · 16 of 47 positions shown · IV contrast (iopamidol)
Comparison: CT head 05/14/2017

CLINICAL DATA: Severe headache, worst headache of life.  Dizziness

EXAM:
CT ANGIOGRAPHY HEAD
TECHNIQUE: Multidetector CT imaging of the head was performed using the
standard protocol during bolus administration of intravenous
contrast. Multiplanar CT image reconstructions and MIPs were
obtained to evaluate the vascular anatomy.
CONTRAST:  75mL 3FPUFR-BCJ IOPAMIDOL (3FPUFR-BCJ) INJECTION 76%

[Series 7: ax thin · axial · 0.33mm/px · z∈[-149,-18]mm · 10 of 153 slices shown]
[im 11/153  brain]
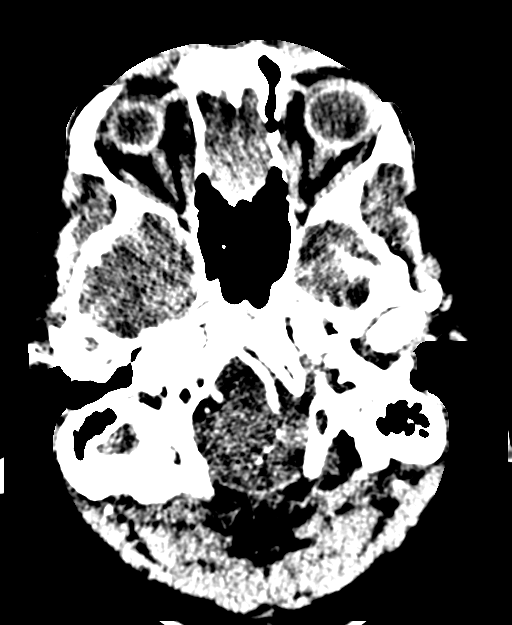
[im 31/153  bone]
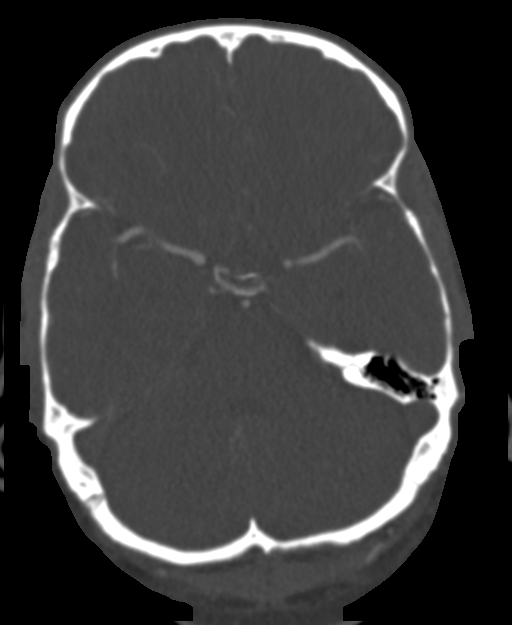
[im 41/153  brain]
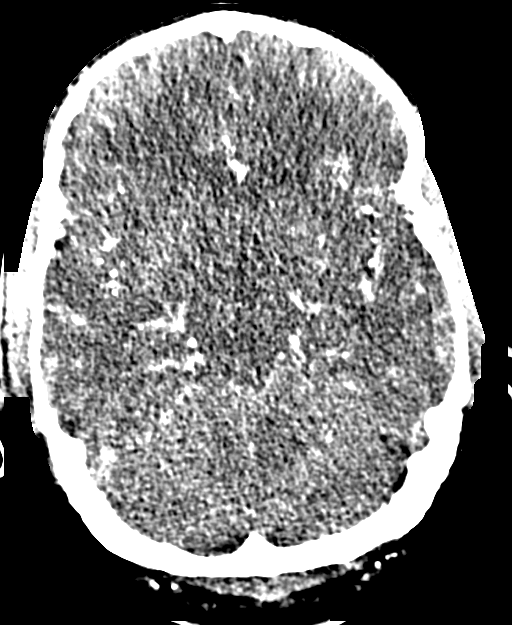
[im 51/153  bone]
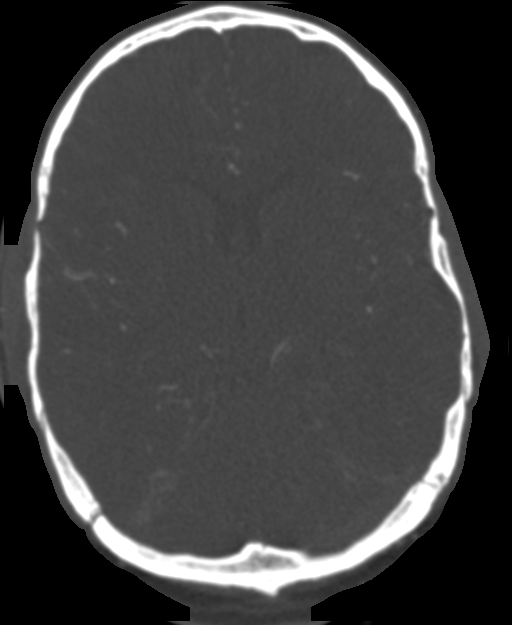
[im 71/153  brain]
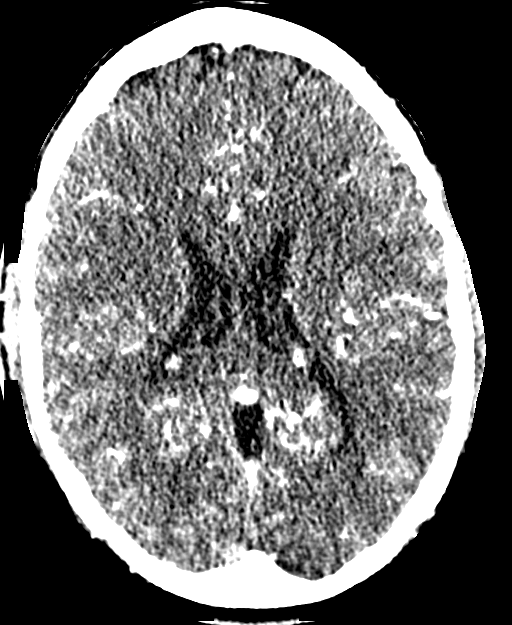
[im 82/153  bone]
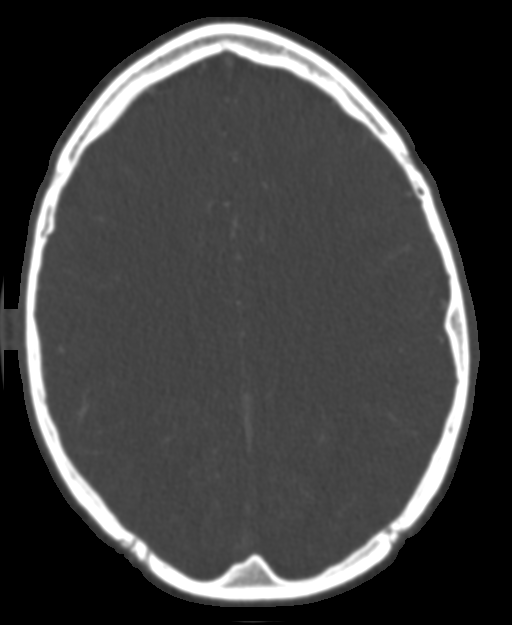
[im 102/153  brain]
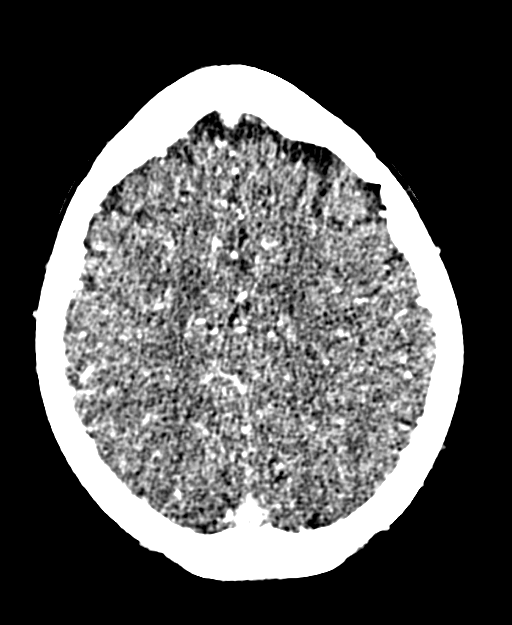
[im 112/153  bone]
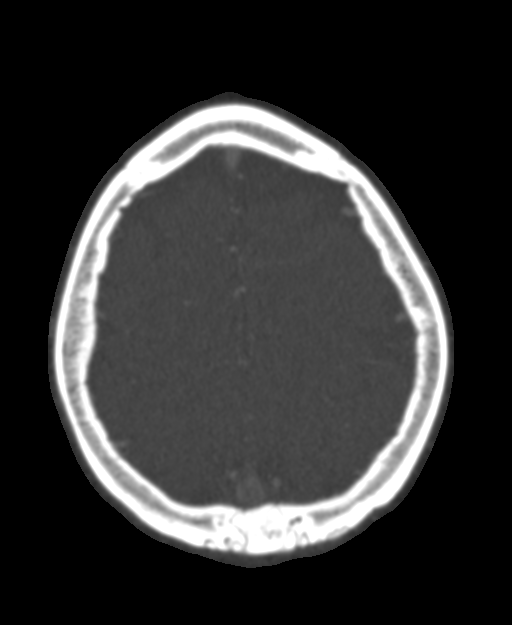
[im 122/153  brain]
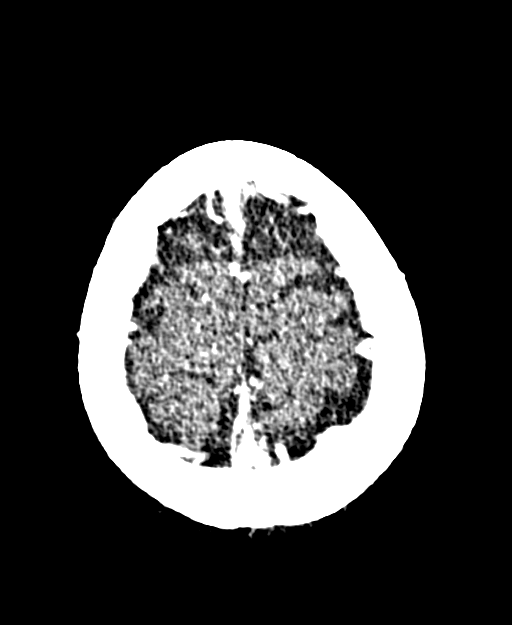
[im 142/153  bone]
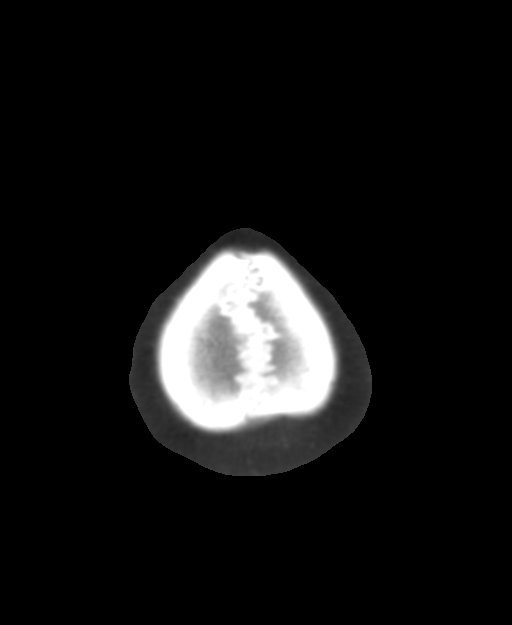

[Series 9: cor thin · coronal · 0.30mm/px · 3 of 208 slices shown]
[im 60/208  brain]
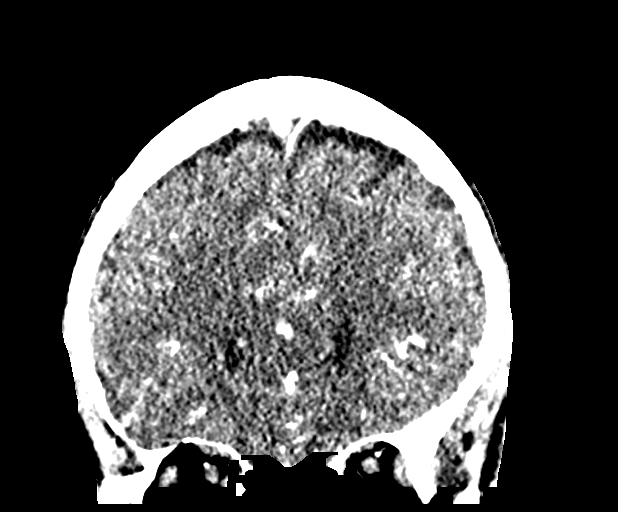
[im 89/208  brain]
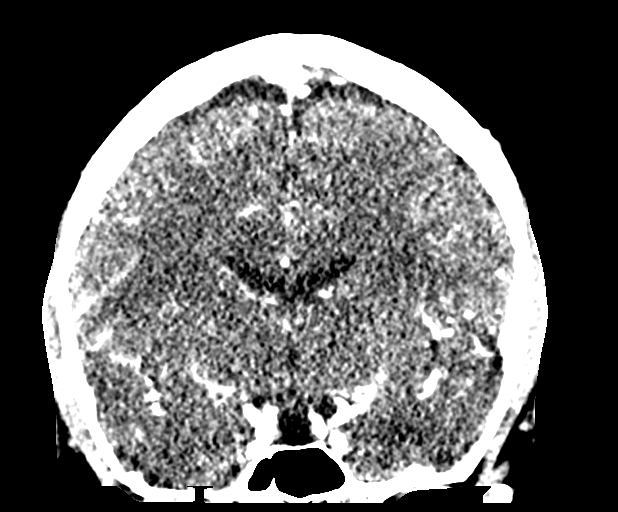
[im 119/208  brain]
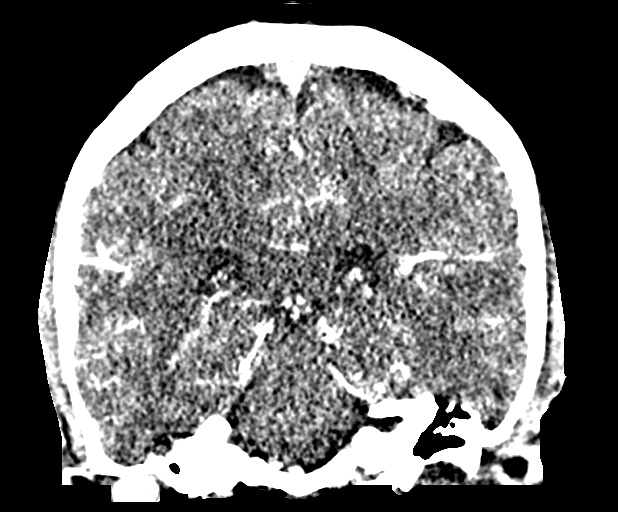

[Series 11: sag thin · sagittal · 0.30mm/px · 3 of 170 slices shown]
[im 34/170  brain]
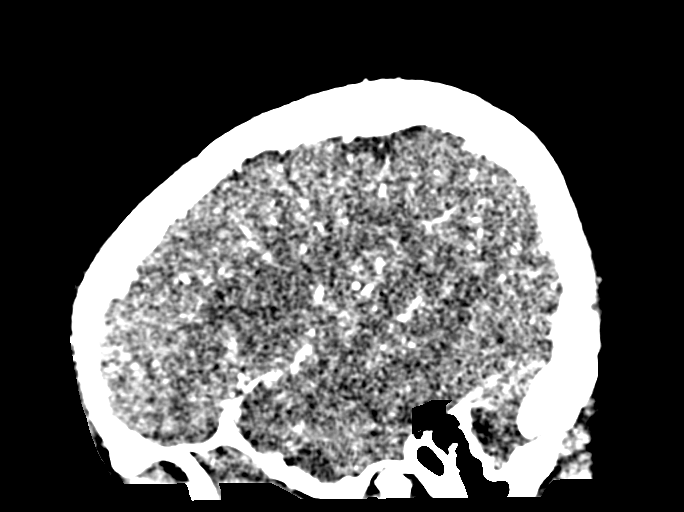
[im 68/170  brain]
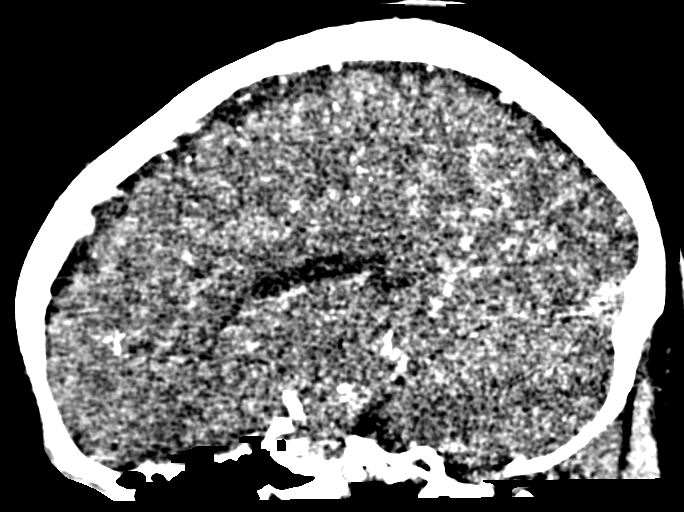
[im 102/170  brain]
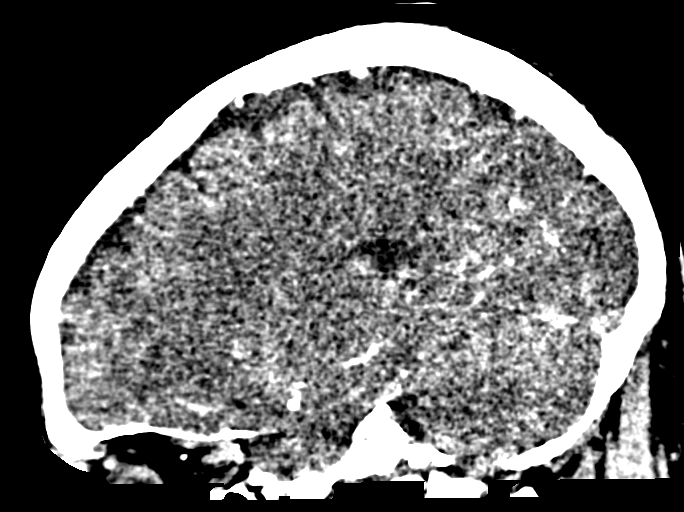

[16 of 47 positions shown; findings below may reference images not displayed]

FINDINGS: CTA HEAD

Anterior circulation: Carotid siphon normal bilaterally. Anterior
and middle cerebral arteries normal bilaterally without stenosis or
irregularity.

Posterior circulation: Both vertebral arteries patent to the basilar
without stenosis. Basilar widely patent. PICA, superior cerebellar,
posterior cerebral arteries are patent bilaterally.

Venous sinuses: Patent

Anatomic variants: Negative for cerebral aneurysm or other vascular
malformation.

Delayed phase: Normal enhancement on delayed imaging.
IMPRESSION: Normal CTA head.  Negative for aneurysm.

## 2019-03-12 IMAGING — CR DG ORBITS FOR FOREIGN BODY
2 series · 2 of 2 positions shown · non-contrast
Comparison: None.

CLINICAL DATA: Metal working/exposure; clearance prior to MRI

EXAM:
ORBITS FOR FOREIGN BODY - 2 VIEW

[orbits waters (1 of 2)]
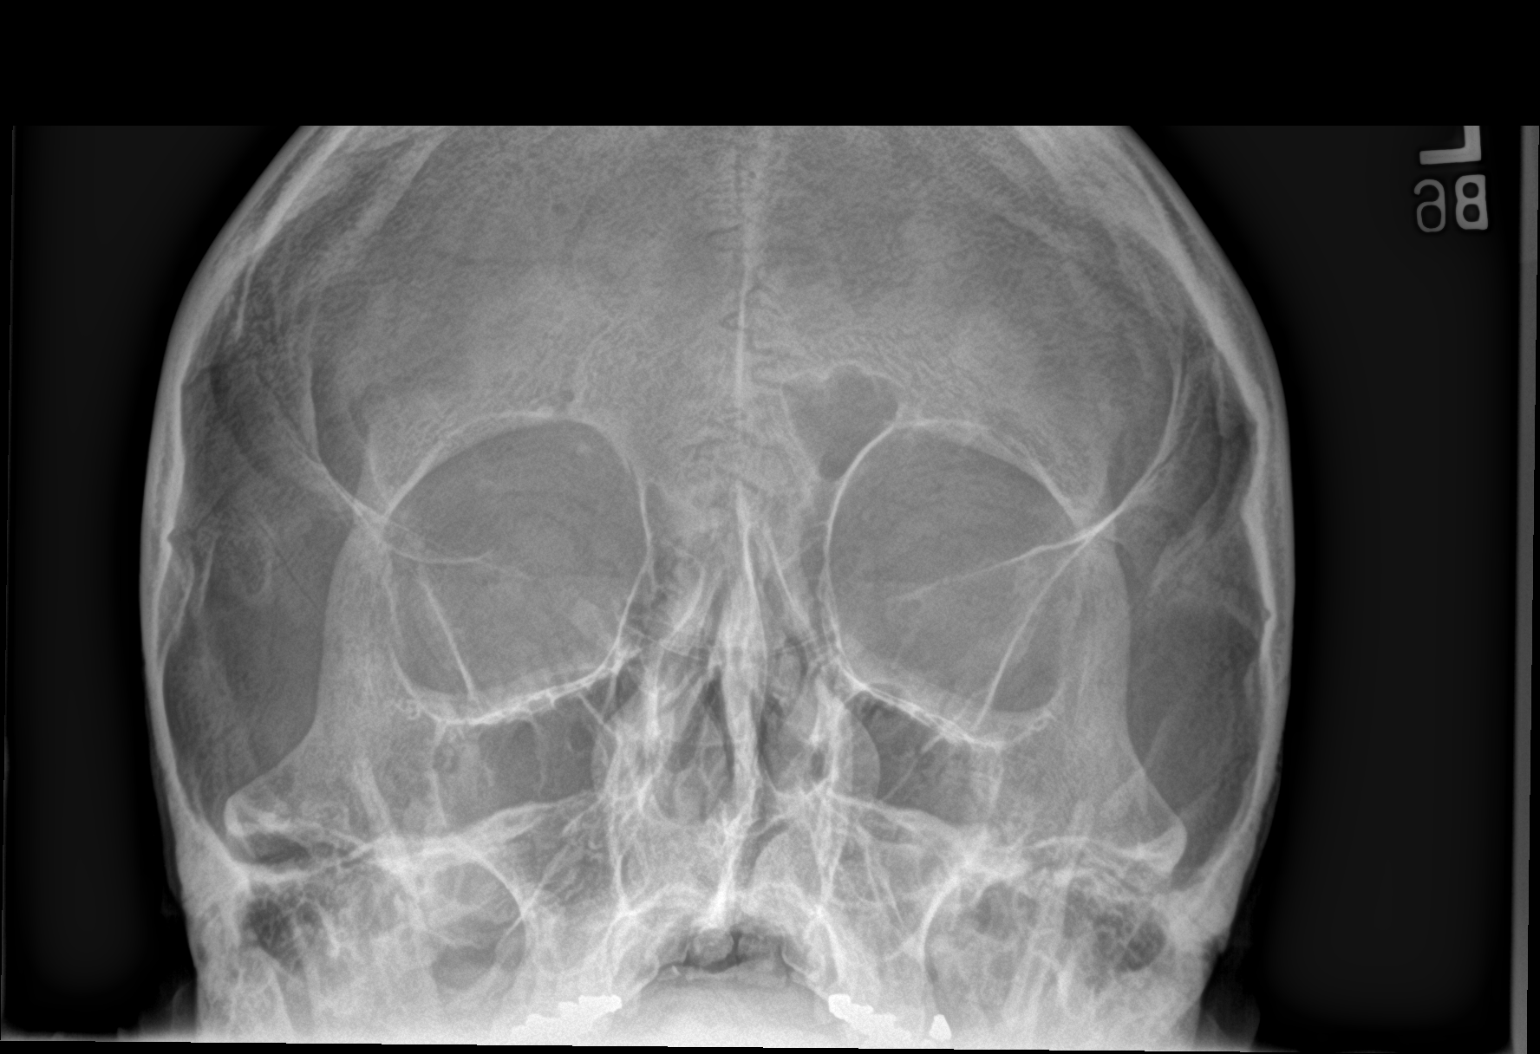

[orbits waters (2 of 2)]
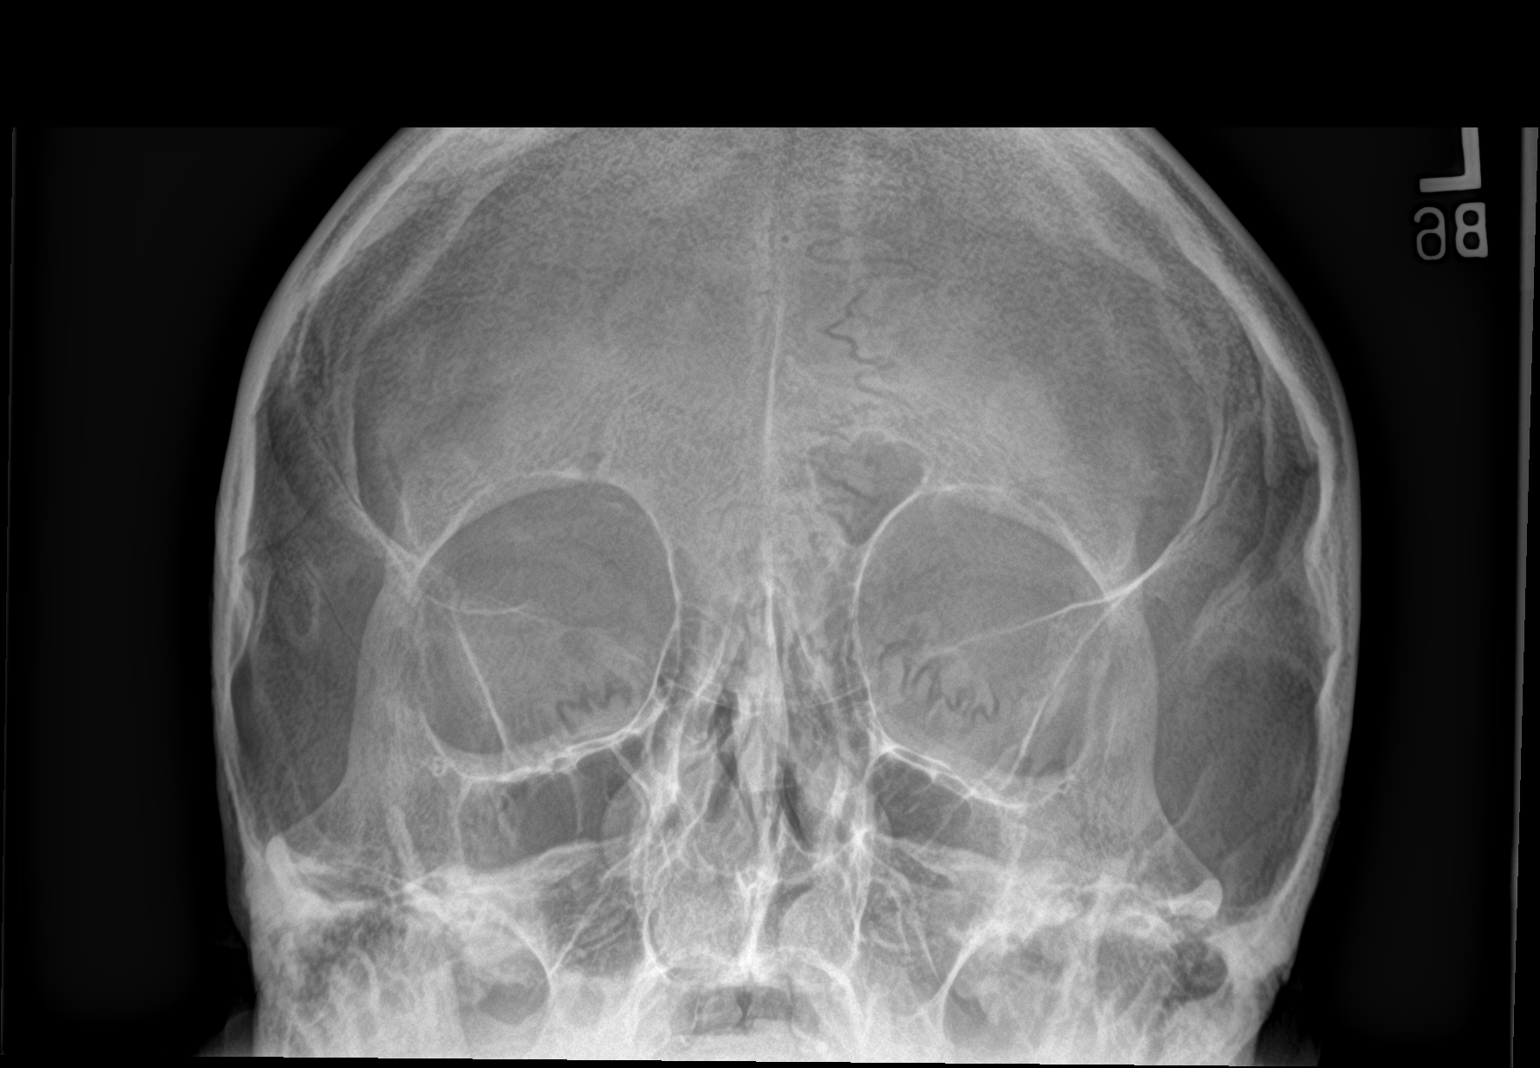

[2 of 2 positions shown; findings below may reference images not displayed]

FINDINGS: There is no evidence of metallic foreign body within the orbits. No
significant bone abnormality identified.
IMPRESSION: No evidence of metallic foreign body within the orbits.
